# Patient Record
Sex: Male | Born: 2005 | Race: Black or African American | Hispanic: No | Marital: Single | Smoking: Former smoker
Health system: Southern US, Community
[De-identification: ages and names within clinical notes are randomized; demographics above are authoritative.]

## PROBLEM LIST (undated history)

## (undated) DIAGNOSIS — R7303 Prediabetes: Secondary | ICD-10-CM

## (undated) HISTORY — PX: CIRCUMCISION REVISION: SHX1347

## (undated) HISTORY — DX: Prediabetes: R73.03

## (undated) HISTORY — PX: CIRCUMCISION: SUR203

---
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
## 2023-03-05 ENCOUNTER — Ambulatory Visit (HOSPITAL_COMMUNITY)
Admission: RE | Admit: 2023-03-05 | Discharge: 2023-03-05 | Disposition: A | Discharge status: Home / Self Care | Payer: Medicaid Other | Source: Ambulatory Visit | Attending: Internal Medicine | Admitting: Internal Medicine

## 2023-03-05 ENCOUNTER — Encounter (HOSPITAL_COMMUNITY): Payer: Self-pay

## 2023-03-05 VITALS — BP 116/74 | HR 77 | Temp 98.3°F | Resp 18

## 2023-03-05 DIAGNOSIS — J0120 Acute ethmoidal sinusitis, unspecified: Secondary | ICD-10-CM | POA: Diagnosis not present

## 2023-03-05 MED ORDER — FLUTICASONE PROPIONATE 50 MCG/ACT NA SUSP: 2.0000 | Freq: Every day | NASAL | 0 refills | Status: DC

## 2023-03-05 MED ORDER — AMOXICILLIN-POT CLAVULANATE 875-125 MG PO TABS: 1.0000 | ORAL_TABLET | Freq: Two times a day (BID) | ORAL | 0 refills | Status: DC

## 2023-03-05 NOTE — ED Triage Notes (Signed)
 Patient presents with c/o nasal congestion, sinus pressure, headache and dizziness for one week. Patient states he has not taken anything for the symptoms.

## 2023-03-05 NOTE — ED Provider Notes (Signed)
 [facility]    CSN: [id number] Arrival date & time: 03/05/23  1325      History   Chief Complaint Chief Complaint  Patient presents with   Dizziness    Not feeling well at all, maybe sinusitis or the flu - Entered by patient   Facial Pain   Nasal Congestion    HPI Jorge Cochran is a 18 y.o. male who presents with nose congestion, sinus pressure, HA x 1 week. Has  ago. 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 Has not been taking any medications. The nose mucous has been yellow with some tinges of blood at time. Has missed school all week. He has also had R upper molar pain, but does not have any dental problems.     Past Medical History:  Diagnosis Date   Prediabetes     Patient Active Problem List   Diagnosis Date Noted   Other hyperlipidemia 10/01/2021   Prediabetes 08/17/2021   Tobacco use 08/17/2021   Mixed hyperlipidemia 07/13/2020   Class 3 severe obesity with serious comorbidity and body mass index (BMI) of 40.0 to 44.9 in adult (HCC) 07/13/2020   Insulin resistance 10/12/2019   Vitamin D deficiency 06/23/2019    Past Surgical History:  Procedure Laterality Date   CIRCUMCISION     CIRCUMCISION REVISION         Home Medications    Prior to Admission medications   Medication Sig Start Date End Date Taking? Authorizing Provider  amoxicillin-clavulanate (AUGMENTIN) 875-125 MG tablet Take 1 tablet by mouth every 12 (twelve) hours. 03/05/23  Yes Rodriguez-Southworth, Nettie Elm, PA-C  fluticasone (FLONASE) 50 MCG/ACT nasal spray Place 2 sprays into both nostrils daily. 03/05/23  Yes Rodriguez-Southworth, Nettie Elm, PA-C    Family History Family History  Problem Relation Age of Onset   Diabetes Mother    Diabetes Other    Diabetes Maternal Grandfather    Hypertension Maternal Grandfather     Social History Social History   Tobacco Use   Smoking status:  Former    Current packs/day: 0.25    Average packs/day: 0.3 packs/day for 3.1 years (0.8 ttl pk-yrs)    Types: Cigarettes    Start date: 01/15/2020   Smokeless tobacco: Never  Vaping Use   Vaping status: Every Day  Substance Use Topics   Alcohol use: Never   Drug use: Never     Allergies   Patient has no known allergies.   Review of Systems Review of Systems As noted inHPI  Physical Exam Triage Vital Signs ED Triage Vitals  Encounter Vitals Group     BP 03/05/23 1401 116/74     Systolic BP Percentile --      Diastolic BP Percentile --      Pulse Rate 03/05/23 1401 77     Resp 03/05/23 1401 18     Temp 03/05/23 1401 98.3 F (36.8 C)     Temp Source 03/05/23 1401 Oral     SpO2 03/05/23 1401 96 %     Weight --      Height --      Head Circumference --      Peak Flow --      Pain Score 03/05/23 1359 7     Pain Loc --      Pain Education --      Exclude from Growth Chart --  Orthostatic VS for the past 24 hrs:  BP- Lying Pulse- Lying BP- Sitting Pulse- Sitting BP- Standing at 0 minutes Pulse- Standing at 0 minutes  03/05/23 1436 117/71 86 110/77 74 119/80 87    Updated Vital Signs BP 116/74 (BP Location: Left Arm)   Pulse 77   Temp 98.3 F (36.8 C) (Oral)   Resp 18   SpO2 96%   Visual Acuity Right Eye Distance:   Left Eye Distance:   Bilateral Distance:    Right Eye Near:   Left Eye Near:    Bilateral Near:     Physical Exam Vitals and nursing note reviewed.  Constitutional:      General: He is not in acute distress.    Appearance: He is normal weight. He is not toxic-appearing.  HENT:     Head: Normocephalic.     Right Ear: Tympanic membrane, ear canal and external ear normal.     Left Ear: Tympanic membrane and external ear normal.     Nose: Congestion present.     Comments: Has tenderness on R ethmoid and frontal sinuses     Mouth/Throat:     Mouth: Mucous membranes are moist.     Pharynx: Oropharynx is clear.  Eyes:     General: Lids are  normal. No scleral icterus.       Right eye: No discharge or hordeolum.        Left eye: No discharge or hordeolum.     Extraocular Movements:     Right eye: Normal extraocular motion.     Left eye: Normal extraocular motion.     Conjunctiva/sclera: Conjunctivae normal.     Right eye: Right conjunctiva is not injected. No hemorrhage.    Left eye: Left conjunctiva is not injected. No hemorrhage. Cardiovascular:     Rate and Rhythm: Normal rate and regular rhythm.     Heart sounds: No murmur heard. Pulmonary:     Effort: Pulmonary effort is normal.     Breath sounds: Normal breath sounds.  Musculoskeletal:        General: Normal range of motion.     Cervical back: Neck supple.  Lymphadenopathy:     Cervical: No cervical adenopathy.  Skin:    General: Skin is warm and dry.     Findings: No rash.  Neurological:     Mental Status: He is alert and oriented to person, place, and time.     Gait: Gait normal.  Psychiatric:        Mood and Affect: Mood normal.        Behavior: Behavior normal.        Thought Content: Thought content normal.        Judgment: Judgment normal.      UC Treatments / Results  Labs (all labs ordered are listed, but only abnormal results are displayed) Labs Reviewed - No data to display  EKG   Radiology No results found.  Procedures Procedures (including critical care time)  Medications Ordered in UC Medications - No data to display  Initial Impression / Assessment and Plan / UC Course  I have reviewed the triage vital signs and the nursing notes.  R ethmoid and frontal sinus headaches and possible infections. I believe the cold he had with trying to fly has provoked worsening of sinus inflammations.   I placed him on Flonase and Augmentin as noted.   Final Clinical Impressions(s) / UC Diagnoses   Final diagnoses:  Acute ethmoidal sinusitis, recurrence  not specified   Discharge Instructions   None    ED Prescriptions      Medication Sig Dispense Auth. Provider   fluticasone (FLONASE) 50 MCG/ACT nasal spray Place 2 sprays into both nostrils daily. 16 g Rodriguez-Southworth, Aarion Metzgar, PA-C   amoxicillin-clavulanate (AUGMENTIN) 875-125 MG tablet Take 1 tablet by mouth every 12 (twelve) hours. 14 tablet Rodriguez-Southworth, Nettie Elm, PA-C      PDMP not reviewed this encounter.   Garey Ham, [place] 03/05/23 1514

## 2023-05-12 ENCOUNTER — Ambulatory Visit (HOSPITAL_COMMUNITY)
Admission: EM | Admit: 2023-05-12 | Discharge: 2023-05-12 | Disposition: A | Discharge status: Home / Self Care | Attending: Family Medicine | Admitting: Family Medicine

## 2023-05-12 ENCOUNTER — Other Ambulatory Visit: Payer: Self-pay

## 2023-05-12 DIAGNOSIS — L509 Urticaria, unspecified: Secondary | ICD-10-CM

## 2023-05-12 MED ORDER — METHYLPREDNISOLONE SODIUM SUCC 125 MG IJ SOLR
INTRAMUSCULAR | Status: AC
  Filled 2023-05-12: qty 2

## 2023-05-12 MED ORDER — METHYLPREDNISOLONE 4 MG PO TBPK: ORAL_TABLET | ORAL | 0 refills | Status: DC

## 2023-05-12 MED ORDER — METHYLPREDNISOLONE SODIUM SUCC 125 MG IJ SOLR
80.0000 mg | Freq: Once | INTRAMUSCULAR | Status: AC
  Administered 2023-05-12: 80 mg via INTRAMUSCULAR

## 2023-05-12 NOTE — ED Provider Notes (Signed)
 [facility]    CSN: [id number] Arrival date & time: 05/12/23  0957      History   Chief Complaint Chief Complaint  Patient presents with   Urticaria    HPI Jorge Cochran is a 18 y.o. male.    Urticaria  Patient is here for breaking out into hives randomly since yesterday.  Comes and goes.  Right now the rash is located on his legs and arms.  Right now his left foot is also numb, the right hand was a bit numb.  No otc medications were taken.  No new foods, medications or topicals.  No difficulty breathing or sob.  He is in school, it is finals.        Past Medical History:  Diagnosis Date   Prediabetes     Patient Active Problem List   Diagnosis Date Noted   Other hyperlipidemia 10/01/2021   Prediabetes 08/17/2021   Tobacco use 08/17/2021   Mixed hyperlipidemia 07/13/2020   Class 3 severe obesity with serious comorbidity and body mass index (BMI) of 40.0 to 44.9 in adult [facility]) 07/13/2020   Insulin  resistance 10/12/2019   Vitamin D  deficiency 06/23/2019    Past Surgical History:  Procedure Laterality Date   CIRCUMCISION     CIRCUMCISION REVISION         Home Medications    Prior to Admission medications   Medication Sig Start Date End Date Taking? Authorizing Provider  amoxicillin -clavulanate (AUGMENTIN ) 875-125 MG tablet Take 1 tablet by mouth every 12 (twelve) hours. 03/05/23   Rodriguez-Southworth, Sylvia, PA-C  fluticasone  (FLONASE ) 50 MCG/ACT nasal spray Place 2 sprays into both nostrils daily. 03/05/23   Rodriguez-Southworth, Lamond Pilot, PA-C    Family History Family History  Problem Relation Age of Onset   Diabetes Mother    Diabetes Other    Diabetes Maternal Grandfather    Hypertension Maternal Grandfather     Social History Social History   Tobacco Use   Smoking status: Former    Current packs/day: 0.25    Average packs/day: 0.3 packs/day for 3.3 years (0.8 ttl pk-yrs)    Types: Cigarettes    Start date: 01/15/2020    Smokeless tobacco: Never  Vaping Use   Vaping status: Every Day  Substance Use Topics   Alcohol use: Never   Drug use: Never     Allergies   Patient has no known allergies.   Review of Systems Review of Systems  Constitutional: Negative.   HENT: Negative.    Respiratory: Negative.    Cardiovascular: Negative.   Gastrointestinal: Negative.   Skin:  Positive for rash.  Neurological:  Positive for numbness.  Psychiatric/Behavioral: Negative.       Physical Exam Triage Vital Signs ED Triage Vitals  Encounter Vitals Group     BP 05/12/23 1038 119/77     Systolic BP Percentile --      Diastolic BP Percentile --      Pulse Rate 05/12/23 1038 79     Resp 05/12/23 1038 20     Temp 05/12/23 1038 98.3 F (36.8 C)     Temp src --      SpO2 05/12/23 1038 96 %     Weight --      Height --      Head Circumference --      Peak Flow --      Pain Score 05/12/23 1036 0     Pain Loc --      Pain Education --  Exclude from Growth Chart --    No data found.  Updated Vital Signs BP 119/77   Pulse 79   Temp 98.3 F (36.8 C)   Resp 20   SpO2 96%   Visual Acuity Right Eye Distance:   Left Eye Distance:   Bilateral Distance:    Right Eye Near:   Left Eye Near:    Bilateral Near:     Physical Exam Constitutional:      General: He is not in acute distress.    Appearance: Normal appearance. He is normal weight. He is not ill-appearing or toxic-appearing.  HENT:     Nose: Nose normal.  Cardiovascular:     Rate and Rhythm: Normal rate and regular rhythm.  Pulmonary:     Effort: Pulmonary effort is normal.     Breath sounds: Normal breath sounds. No wheezing or rhonchi.  Musculoskeletal:     Cervical back: Normal range of motion and neck supple.  Skin:    Comments: Raised erythematous papules to the antecubital fossa bilaterally;  the back of the left hand;  The thighs, scattered down the legs;   Neurological:     General: No focal deficit present.     Mental  Status: He is alert and oriented to person, place, and time.     Sensory: No sensory deficit.     Motor: No weakness.  Psychiatric:        Mood and Affect: Mood normal.      UC Treatments / Results  Labs (all labs ordered are listed, but only abnormal results are displayed) Labs Reviewed - No data to display  EKG   Radiology No results found.  Procedures Procedures (including critical care time)  Medications Ordered in UC Medications  methylPREDNISolone sodium succinate (SOLU-MEDROL) 125 mg/2 mL injection 80 mg (has no administration in time range)    Initial Impression / Assessment and Plan / UC Course  I have reviewed the triage vital signs and the nursing notes.  Pertinent labs & imaging results that were available during my care of the patient were reviewed by me and considered in my medical decision making (see chart for details).    Final Clinical Impressions(s) / UC Diagnoses   Final diagnoses:  Hives     Discharge Instructions      You were seen today for hives.  I have given you a shot of a steroid today.  I have sent out oral prednisone to start tomorrow.  You may use zyrtec/clartin, as well as benadryl over the counter for itching.  Return if not improving.      ED Prescriptions     Medication Sig Dispense Auth. Provider   methylPREDNISolone (MEDROL DOSEPAK) 4 MG TBPK tablet Take as directed 1 each Lesle Ras, MD      PDMP not reviewed this encounter.   Lesle Ras, MD 05/12/23 1054

## 2023-05-12 NOTE — ED Triage Notes (Addendum)
 PT reports he breaks out in hives randomly all over his body starting yesterday. PT has not tried any OTC. Pt also reports random numbness in Lt foot and RT hand.

## 2023-05-12 NOTE — Discharge Instructions (Signed)
 You were seen today for hives.  I have given you a shot of a steroid today.  I have sent out oral prednisone to start tomorrow.  You may use zyrtec/clartin, as well as benadryl over the counter for itching.  Return if not improving.

## 2023-05-12 NOTE — ED Triage Notes (Signed)
 PT DOB and full name confirmed .

## 2023-05-22 ENCOUNTER — Ambulatory Visit (INDEPENDENT_AMBULATORY_CARE_PROVIDER_SITE_OTHER)

## 2023-05-22 ENCOUNTER — Ambulatory Visit (HOSPITAL_COMMUNITY)
Admission: RE | Admit: 2023-05-22 | Discharge: 2023-05-22 | Disposition: A | Discharge status: Home / Self Care | Payer: Self-pay | Source: Ambulatory Visit | Attending: Family Medicine | Admitting: Family Medicine

## 2023-05-22 ENCOUNTER — Encounter (HOSPITAL_COMMUNITY): Payer: Self-pay

## 2023-05-22 VITALS — BP 120/79 | HR 88 | Temp 98.5°F | Resp 18

## 2023-05-22 DIAGNOSIS — M533 Sacrococcygeal disorders, not elsewhere classified: Secondary | ICD-10-CM | POA: Diagnosis not present

## 2023-05-22 NOTE — ED Provider Notes (Signed)
 [facility]    CSN: [id number] Arrival date & time: 05/22/23  1004      History   Chief Complaint Chief Complaint  Patient presents with   Fall   Tailbone Pain   Loss of Consciousness    HPI Jorge Cochran is a 18 y.o. male.    Fall  Loss of Consciousness  Patient is here for tailbone pain after a fall.  Earlier in the week he "passed out" and fell and hit his head.  He is not sure why this happened.  He is feeling well otherwise, expect for continued low back/tailbone pain.  No numbness/tingling into the legs.  No bowel/bladder issues.  He otherwise feels well.     Past Medical History:  Diagnosis Date   Prediabetes     Patient Active Problem List   Diagnosis Date Noted   Other hyperlipidemia 10/01/2021   Prediabetes 08/17/2021   Tobacco use 08/17/2021   Mixed hyperlipidemia 07/13/2020   Class 3 severe obesity with serious comorbidity and body mass index (BMI) of 40.0 to 44.9 in adult 07/13/2020   Insulin  resistance 10/12/2019   Vitamin D  deficiency 06/23/2019    Past Surgical History:  Procedure Laterality Date   CIRCUMCISION     CIRCUMCISION REVISION         Home Medications    Prior to Admission medications   Medication Sig Start Date End Date Taking? Authorizing Provider  methylPREDNISolone  (MEDROL  DOSEPAK) 4 MG TBPK tablet Take as directed 05/12/23   Lesle Ras, MD    Family History Family History  Problem Relation Age of Onset   Diabetes Mother    Diabetes Other    Diabetes Maternal Grandfather    Hypertension Maternal Grandfather     Social History Social History   Tobacco Use   Smoking status: Former    Current packs/day: 0.25    Average packs/day: 0.3 packs/day for 3.3 years (0.8 ttl pk-yrs)    Types: Cigarettes    Start date: 01/15/2020   Smokeless tobacco: Never  Vaping Use   Vaping status: Every Day  Substance Use Topics   Alcohol use: Never   Drug use: Never     Allergies   Patient has no known  allergies.   Review of Systems Review of Systems  Constitutional: Negative.   HENT: Negative.    Respiratory: Negative.    Cardiovascular:  Positive for syncope.  Gastrointestinal: Negative.   Musculoskeletal:  Positive for back pain.     Physical Exam Triage Vital Signs ED Triage Vitals  Encounter Vitals Group     BP 05/22/23 1115 120/79     Systolic BP Percentile --      Diastolic BP Percentile --      Pulse Rate 05/22/23 1115 88     Resp 05/22/23 1115 18     Temp 05/22/23 1115 98.5 F (36.9 C)     Temp Source 05/22/23 1115 Oral     SpO2 05/22/23 1115 97 %     Weight --      Height --      Head Circumference --      Peak Flow --      Pain Score 05/22/23 1114 10     Pain Loc --      Pain Education --      Exclude from Growth Chart --    No data found.  Updated Vital Signs BP 120/79 (BP Location: Left Arm)   Pulse 88   Temp 98.5 F (  36.9 C) (Oral)   Resp 18   SpO2 97%   Visual Acuity Right Eye Distance:   Left Eye Distance:   Bilateral Distance:    Right Eye Near:   Left Eye Near:    Bilateral Near:     Physical Exam Constitutional:      Appearance: Normal appearance. He is normal weight.  Musculoskeletal:     Comments: TTP to the sacrum and coccyx;  able to sit on the table with minimal pain   Neurological:     General: No focal deficit present.     Mental Status: He is alert.  Psychiatric:        Mood and Affect: Mood normal.      UC Treatments / Results  Labs (all labs ordered are listed, but only abnormal results are displayed) Labs Reviewed - No data to display  EKG   Radiology No results found.  Procedures Procedures (including critical care time)  Medications Ordered in UC Medications - No data to display  Initial Impression / Assessment and Plan / UC Course  I have reviewed the triage vital signs and the nursing notes.  Pertinent labs & imaging results that were available during my care of the patient were reviewed by  me and considered in my medical decision making (see chart for details).   Final Clinical Impressions(s) / UC Diagnoses   Final diagnoses:  Coccyx pain     Discharge Instructions      You were seen today for tailbone pain after a fall.  Your xray appears normal, but I will call you if the radiologist reads this differently.  Either way, we recommend motrin/advil for any pain, and given this time.  You may get a donut pillow to help alleviate pressure.  Please return if you have worsening symptoms over time.   ED Prescriptions   None    PDMP not reviewed this encounter.   Lesle Ras, MD 05/22/23 1201

## 2023-05-22 NOTE — ED Triage Notes (Signed)
 Pt states that he passed out last Tuesday twice and hit his head and tailbone. He now complains of tailbone pain which hasn't stopped since passing out. He has been icing, IBU and stretching but no relief.

## 2023-05-22 NOTE — Discharge Instructions (Addendum)
 You were seen today for tailbone pain after a fall.  Your xray appears normal, but I will call you if the radiologist reads this differently.  Either way, we recommend motrin/advil for any pain, and given this time.  You may get a donut pillow to help alleviate pressure.  Please return if you have worsening symptoms over time.

## 2023-05-25 ENCOUNTER — Ambulatory Visit (HOSPITAL_COMMUNITY): Admission: EM | Admit: 2023-05-25 | Discharge: 2023-05-25 | Disposition: A | Discharge status: Home / Self Care

## 2023-05-25 ENCOUNTER — Encounter (HOSPITAL_COMMUNITY): Payer: Self-pay

## 2023-05-25 DIAGNOSIS — S300XXA Contusion of lower back and pelvis, initial encounter: Secondary | ICD-10-CM

## 2023-05-25 MED ORDER — TRAMADOL HCL 50 MG PO TABS: 50.0000 mg | ORAL_TABLET | Freq: Four times a day (QID) | ORAL | 0 refills | Status: DC | PRN

## 2023-05-25 MED ORDER — CYCLOBENZAPRINE HCL 10 MG PO TABS: 10.0000 mg | ORAL_TABLET | Freq: Three times a day (TID) | ORAL | 0 refills | Status: DC | PRN

## 2023-05-25 NOTE — ED Triage Notes (Signed)
 Patient was seen 05/22/23 for the same tailbone pain. States it is worse, unable to sleep, trouble moving. Denies any further injury or falls. Unable to lay flat on the side, back, or stomach. Having a hard time standing.   Patient taking ibuprofen, Tylenol, and Advil with no relief. Tried Icy Hot topical and ice packs with no relief.

## 2023-05-25 NOTE — ED Provider Notes (Signed)
 [facility]  Note:  This document was prepared using Dragon voice recognition software and may include unintentional dictation errors.  MRN: [id number] DOB: [birth year]  Subjective:   Jorge Cochran is a 18 y.o. male presenting for continued pain to his tailbone after a fall that occurred approximately 2 weeks ago.  Patient was seen here in urgent care x-ray performed which was negative for fracture to the tailbone.  Patient states that he has not slept in 3 days because he cannot get comfortable laying on his side back or abdomen.  Patient has continued pain and has taken ibuprofen, Tylenol, IcyHot, ice with no improvement.  Patient denies any other trauma or injury to the tailbone since previous.  No current facility-administered medications for this encounter.  Current Outpatient Medications:    cyclobenzaprine (FLEXERIL) 10 MG tablet, Take 1 tablet (10 mg total) by mouth 3 (three) times daily as needed for muscle spasms., Disp: 30 tablet, Rfl: 0   methylPREDNISolone  (MEDROL  DOSEPAK) 4 MG TBPK tablet, Take as directed, Disp: 1 each, Rfl: 0   traMADol (ULTRAM) 50 MG tablet, Take 1 tablet (50 mg total) by mouth every 6 (six) hours as needed., Disp: 15 tablet, Rfl: 0   No Known Allergies  Past Medical History:  Diagnosis Date   Prediabetes      Past Surgical History:  Procedure Laterality Date   CIRCUMCISION     CIRCUMCISION REVISION      Family History  Problem Relation Age of Onset   Diabetes Mother    Diabetes Other    Diabetes Maternal Grandfather    Hypertension Maternal Grandfather     Social History   Tobacco Use   Smoking status: Former    Current packs/day: 0.25    Average packs/day: 0.3 packs/day for 3.4 years (0.8 ttl pk-yrs)    Types: Cigarettes    Start date: 01/15/2020   Smokeless tobacco: Never  Vaping Use   Vaping status: Every Day  Substance Use Topics   Alcohol use: Never   Drug use: Never    ROS Refer to HPI for ROS  details.  Objective:   Vitals: BP 117/63 (BP Location: Left Arm)   Pulse 95   Temp 98 F (36.7 C) (Oral)   Resp 18   SpO2 97%   Physical Exam Vitals and nursing note reviewed.  Constitutional:      General: He is not in acute distress.    Appearance: He is well-developed. He is not ill-appearing or toxic-appearing.  HENT:     Head: Normocephalic.  Cardiovascular:     Rate and Rhythm: Normal rate.  Pulmonary:     Effort: Pulmonary effort is normal. No respiratory distress.  Musculoskeletal:     Lumbar back: Tenderness and bony tenderness present. No swelling, signs of trauma or spasms. Normal range of motion.  Skin:    General: Skin is warm and dry.  Neurological:     General: No focal deficit present.     Mental Status: He is alert and oriented to person, place, and time.  Psychiatric:        Mood and Affect: Mood normal.        Behavior: Behavior normal.     Procedures  No results found for this or any previous visit (from the past 24 hours).  No results found.   Assessment and Plan :     Discharge Instructions       1. Coccyx contusion, initial encounter (Primary) - cyclobenzaprine (FLEXERIL) 10 MG  tablet; Take 1 tablet (10 mg total) by mouth 3 (three) times daily as needed for muscle spasms.  Dispense: 30 tablet; Refill: 0 - traMADol (ULTRAM) 50 MG tablet; Take 1 tablet (50 mg total) by mouth every 6 (six) hours as needed.  Dispense: 15 tablet; Refill: 0 - Continue to rest the area, apply ice 2-3 times a day for 10 to 15 minutes at a time. -Unfortunately there is no significant therapy that will improve pain and inflammation to the tailbone. -Continue to monitor symptoms for any change in severity if there is any escalation of current symptoms or development of new symptoms follow-up in ER for further evaluation and management.    Jesselyn Rask B Jonnae Fonseca   Henrik Orihuela, [place], [place] 05/25/23 1356

## 2023-05-25 NOTE — Discharge Instructions (Addendum)
  1. Coccyx contusion, initial encounter (Primary) - cyclobenzaprine (FLEXERIL) 10 MG tablet; Take 1 tablet (10 mg total) by mouth 3 (three) times daily as needed for muscle spasms.  Dispense: 30 tablet; Refill: 0 - traMADol (ULTRAM) 50 MG tablet; Take 1 tablet (50 mg total) by mouth every 6 (six) hours as needed.  Dispense: 15 tablet; Refill: 0 - Continue to rest the area, apply ice 2-3 times a day for 10 to 15 minutes at a time. -Unfortunately there is no significant therapy that will improve pain and inflammation to the tailbone. -Continue to monitor symptoms for any change in severity if there is any escalation of current symptoms or development of new symptoms follow-up in ER for further evaluation and management.

## 2023-05-29 ENCOUNTER — Emergency Department (HOSPITAL_BASED_OUTPATIENT_CLINIC_OR_DEPARTMENT_OTHER)
Admission: EM | Admit: 2023-05-29 | Discharge: 2023-05-29 | Disposition: A | Discharge status: Home / Self Care | Attending: Emergency Medicine | Admitting: Emergency Medicine

## 2023-05-29 ENCOUNTER — Emergency Department (HOSPITAL_BASED_OUTPATIENT_CLINIC_OR_DEPARTMENT_OTHER): Admitting: Radiology

## 2023-05-29 ENCOUNTER — Encounter (HOSPITAL_BASED_OUTPATIENT_CLINIC_OR_DEPARTMENT_OTHER): Payer: Self-pay | Admitting: Emergency Medicine

## 2023-05-29 ENCOUNTER — Other Ambulatory Visit (HOSPITAL_BASED_OUTPATIENT_CLINIC_OR_DEPARTMENT_OTHER): Payer: Self-pay

## 2023-05-29 ENCOUNTER — Other Ambulatory Visit: Payer: Self-pay

## 2023-05-29 DIAGNOSIS — M533 Sacrococcygeal disorders, not elsewhere classified: Secondary | ICD-10-CM | POA: Diagnosis present

## 2023-05-29 DIAGNOSIS — L0591 Pilonidal cyst without abscess: Secondary | ICD-10-CM | POA: Diagnosis not present

## 2023-05-29 MED ORDER — SULFAMETHOXAZOLE-TRIMETHOPRIM 800-160 MG PO TABS
1.0000 | ORAL_TABLET | Freq: Two times a day (BID) | ORAL | 0 refills | Status: AC
  Filled 2023-05-29: qty 14, 7d supply, fill #0

## 2023-05-29 MED ORDER — SULFAMETHOXAZOLE-TRIMETHOPRIM 800-160 MG PO TABS
1.0000 | ORAL_TABLET | Freq: Once | ORAL | Status: AC
  Administered 2023-05-29: 1 via ORAL
  Filled 2023-05-29: qty 1

## 2023-05-29 MED ORDER — OXYCODONE-ACETAMINOPHEN 5-325 MG PO TABS
1.0000 | ORAL_TABLET | Freq: Once | ORAL | Status: AC
  Administered 2023-05-29: 1 via ORAL
  Filled 2023-05-29: qty 1

## 2023-05-29 MED ORDER — OXYCODONE-ACETAMINOPHEN 5-325 MG PO TABS
1.0000 | ORAL_TABLET | Freq: Four times a day (QID) | ORAL | 0 refills | Status: DC | PRN
  Filled 2023-05-29: qty 5, 2d supply, fill #0

## 2023-06-03 ENCOUNTER — Encounter (HOSPITAL_COMMUNITY): Payer: Self-pay

## 2023-06-03 NOTE — Progress Notes (Signed)
 REFERRING PHYSICIAN:  Mannie Fairy Shlomo DEWAINE PROVIDER:  RICHERD SILVERSMITH, MD MRN: [id number] DOB: 09-25-2005 DATE OF ENCOUNTER: 06/03/2023 Subjective     Reason for Referral: Pilonidal cyst History of Present Illness: Jorge Cochran is a 18 y.o. male who is seen today as an office consultation for evaluation of pilonidal cyst.  Patient had a fall earlier this month associated with syncope and had persistent tailbone pain for which he presented to UC on 5/8. XR obtained showed no fracture of coccyx. Urgent care provider noted tenderness to palpation of coccyx and sacrum. He was discharged home with recommendations to take tylenol  and motrin for pain. He represented on 5/11 for persistent pain. Per review of UC provider notes, tenderness to palpation was noted to lumbar spine but does not mention coccyx or sacrum or whether these were examined. He was discharged home with flexeril , and tramadol .  Patient then presented to ED at [facility] on 5/15 for non resolving pain. Per review of EDP notes, patient reported bleeding and foul smelling drainage from tailbone for several days. XR ordered which was unremarkable. POCUS by EDP did not reveal any drainable collection. Did report erythema to area of concern. Patient was discharged on Bactrim  and percocet and referral sent to general surgery.  Patient states he has never had pain like this before, never noted a cyst or drainage previously to this episode.  Discussed his syncope noted in EDP notes. States he had been under stress, recently had stress related hives, presumed vasovagal event.    Review of Systems: A complete review of systems was obtained from the patient.  I have reviewed this information and discussed as appropriate with the patient.  ROS otherwise negative except as noted in HPI.   Medical History: Past Medical History:  Diagnosis Date  . Hyperlipidemia     There is no problem list on file for this patient.   Past  Surgical History:  Procedure Laterality Date  . CIRCUMCISION    . circumcision revision       No Known Allergies  Current Outpatient Medications on File Prior to Visit  Medication Sig Dispense Refill  . oxyCODONE -acetaminophen  (PERCOCET) 5-325 mg tablet Take 1 tablet by mouth    . sulfamethoxazole -trimethoprim  (BACTRIM  DS) 800-160 mg tablet Take 1 tablet by mouth 2 (two) times daily     No current facility-administered medications on file prior to visit.    Family History  Problem Relation Age of Onset  . Diabetes Mother      Social History   Tobacco Use  Smoking Status Never  Smokeless Tobacco Never     Social History   Socioeconomic History  . Marital status: Single  Tobacco Use  . Smoking status: Never  . Smokeless tobacco: Never  Vaping Use  . Vaping status: Every Day  Substance and Sexual Activity  . Alcohol use: Yes  . Drug use: Yes    Objective:   Vitals:   06/03/23 1050  BP: 110/72  Pulse: 100  Temp: 36.1 C (97 F)  SpO2: 99%  Weight: (!) 112.5 kg (248 lb)  Height: 175.3 cm (5' 9)  PainSc:   4  PainLoc: Buttocks    Body mass index is 36.62 kg/m.  Physical Exam: General: No acute distress, well appearing HEENT: PERRL, hearing grossly normal, mucous membranes moist CV: Regular rate and rhythm Pulm: Normal work of breathing on room air Abd: Soft, nontender, nondistended GU: Small 2mm opening at tip of coccyx with SS drainage, no erythema,  no purulence, no tenderness. No notable pits. Extremities: Warm and well perfused Neuro: A&O x4, no focal neurologic deficits Psych: Appropriate mood and effect  Labs, Imaging and Diagnostic Testing: I have personally reviewed all imaging and agree with radiologist's interpretation. Sacrum/Coccyx XR 05/29/23: No evidence of fracture No recent labs. I personally reviewed recent ED provider notes.  Assessment and Plan:     Diagnoses and all orders for this visit:  Pilonidal cyst    Given infection  is resolved, reasonable to take wait and watch approach versus excision of cyst. Patient would prefer to observe for now. He will complete his antibiotics which should finish today.  He knows if any symptoms recur he should call our clinic and I am happy to see him back to discuss excision.  Discussed with patient given his syncope, even though likely vasovagal from stress, he should establish care with a PCP for routine check ups and physical exams, labs, etc.      I spent a total of 35 minutes in both face-to-face and non-face-to-face activities, excluding procedures performed, for this visit on the date of this encounter.  Orie Silversmith, MD [facility]

## 2023-06-25 NOTE — Progress Notes (Signed)
 Forms completed and faxed to [employer]

## 2023-09-22 ENCOUNTER — Ambulatory Visit (INDEPENDENT_AMBULATORY_CARE_PROVIDER_SITE_OTHER)

## 2023-09-22 VITALS — BP 126/70 | HR 59 | Ht 69.0 in | Wt 253.0 lb

## 2023-09-22 DIAGNOSIS — R7303 Prediabetes: Secondary | ICD-10-CM | POA: Diagnosis present

## 2023-09-22 DIAGNOSIS — E782 Mixed hyperlipidemia: Secondary | ICD-10-CM

## 2023-09-22 DIAGNOSIS — Z6841 Body Mass Index (BMI) 40.0 and over, adult: Secondary | ICD-10-CM | POA: Diagnosis not present

## 2023-09-22 DIAGNOSIS — M940 Chondrocostal junction syndrome [Tietze]: Secondary | ICD-10-CM | POA: Diagnosis not present

## 2023-09-22 DIAGNOSIS — E66813 Obesity, class 3: Secondary | ICD-10-CM

## 2023-09-22 LAB — POCT GLYCOSYLATED HEMOGLOBIN (HGB A1C): Hemoglobin A1C: 5 % (ref 4.0–5.6)

## 2023-09-22 MED ORDER — DICLOFENAC SODIUM 1 % EX GEL: 2.0000 g | Freq: Four times a day (QID) | CUTANEOUS | 1 refills | Status: DC

## 2023-09-22 NOTE — Patient Instructions (Signed)
 It was great to meet you today!  Today we talked about your weight loss goals, your diet and exercise plan, your sleep schedule and your rib pain.   I think you're doing a lot of really good things with your diet and exercise habits. Keep up the good work. We're going to do blood tests today to get a new baseline for you moving forward. These will check your blood sugar, cholesterol, kidney function and liver health.  For your sleep, I want you to aim for no fewer than 6 hours of sleep, ideally 7 or more. Limit your screens for an hour before you want to sleep to allow your brain to wind down. Keep your room cool and dark.   For your rib pain you likely have inflammation of the junction of your rib and sternum, called costochondritis. I'll prescribe you a topical anti-inflammatory called voltaren  gel that you can apply 2-4 times per day which should help with the discomfort.   Please follow up in 3 months.   Thank you for choosing [facility].   Please call [phone] with any questions about today's appointment.  Leafy Scriver, DO Family Medicine

## 2023-09-22 NOTE — Assessment & Plan Note (Signed)
 Prior A1c borderline at 5.5 in 2023. Denies polyuria or polydipsia, largely asymptomatic hx at this time.  -A1c, CMP today.  Diet and exercise interventions on board appropriately

## 2023-09-22 NOTE — Assessment & Plan Note (Signed)
 Prior LDL 114 in 2023.  -Lipid panel ordered for today. Diet and exercise interventions on board appropriately for now

## 2023-09-22 NOTE — Assessment & Plan Note (Signed)
 Patient's BMI 37 today, 253 lb up from patient reported 220 in the last year.  -CMP given prior hx of elevated LFTs Diet and exercise interventions on board appropriately at this time

## 2023-09-22 NOTE — Progress Notes (Cosign Needed Addendum)
 SUBJECTIVE:   Chief compliant/HPI: annual examination  Jorge Cochran is a 18 y.o. who presents today for an annual exam.   Only acute concern today is R sided costochondral discomfort since 3 months ago. 3/7 days of the week. 7/10 at the worst, stretching hasn't really helped it. Lifting makes it worse, bending over, turning. Periods of transient discomfort, a couple hours at a time. 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   His diet and exercise routine involve waking up at 6, gym for an hour in the mornings. 3 eggs for breakfast, brings lunch or eats healthy options at work. Generally avoids sugary sweets, causes his acne to break out. He's emphasizing a high protein diet w/ veggies and fruits to try to lose weight given his hx of insulin  resistance and weight gain. At the gym he does weightlifting, treadmill and stair master for cardio. His goal weight is 212, he did have a breakup this year that led to him having a number of weeks where he stopped exercising, was eating lots of unhealthy food, but he feels like he's back on the wagon now and is motivated to reach his health goals.   He takes no medications every day except for OTC fish oil, Vit D.   OBJECTIVE:   BP 126/70   Pulse (!) 59   Ht 5' 9 (1.753 m)   Wt 253 lb (114.8 kg)   SpO2 97%   BMI 37.36 kg/m   General: Awake, alert, NAD. Communicates clearly. HEENT: NCAT, dentition intact. PERRLA EOMI. Anicteric sclera, conjunctiva clear.  MMM, clear OP w/ no exudates or erythema.  Cardio: RRR. Normal S1, S2. No murmur, rub, gallop. 2+ radial and dorsalis pedis pulses b/l w/ good capillary refill.  Resp: CTA bilaterally. No wheezes, rales, or rhonchi. Normal work of breathing on room air Abdomen: soft, non-tender, non-distended. Normoactive BS auscultated. No guarding, rigidity, or rebound.  MSK: Full ROM w/ no TTP or obvious deformity in b/l UE and LE. R sided chest wall TTP over medial rib  margin. Full shoulder ROM b/l with mild exacerbation of symptoms w/ extension + abduction + ext rotation of R shoulder.  Skin: good skin turgor, no rash or lesions appreciated. Mild hyperpigmentation around posterior neck and upper trap region, possible acanthosis given his hx. Neuro: AOx4. Bilateral UE and LE strength 5/5. Sensation to light touch intact and symmetrical bilaterally. Psych: Appropriate mood and affect. No SI/HI.   ASSESSMENT/PLAN:   Assessment & Plan Mixed hyperlipidemia Prior LDL 114 in 2023.  -Lipid panel ordered for today. Diet and exercise interventions on board appropriately for now Class 3 severe obesity with serious comorbidity and body mass index (BMI) of 40.0 to 44.9 in adult Patient's BMI 37 today, 253 lb up from patient reported 220 in the last year.  -CMP given prior hx of elevated LFTs Diet and exercise interventions on board appropriately at this time Prediabetes Prior A1c borderline at 5.5 in 2023. Denies polyuria or polydipsia, largely asymptomatic hx at this time.  -A1c, CMP today.  Diet and exercise interventions on board appropriately Costochondritis R sided medial rib pain, TTP, positional, intermittent symptoms w/o affecting his work duties. -Voltaren  4x daily   Annual Examination  See AVS for age appropriate recommendations  PHQ score 9, reviewed and discussed.  -Patient's mood questionnaire notable for little interest in doing things and feeling tired. Initial interventions targeted at increasing sleep and regulating his circadian rhythm, will see him in 3  months at which point it'll be appropriate to do further workup of underlying mood disorder.   Blood pressure reviewed and at goal.   Considered the following items based upon USPSTF recommendations: HIV testing: not indicated Hepatitis C: not indicated Hepatitis B: not indicated Syphilis if at high risk: {not indicated GC/CTnot indicated Lipid panel (nonfasting or fasting) discussed based  upon AHA recommendations and ordered.  Consider repeat every 4-6 years.  Reviewed risk factors for latent tuberculosis and not indicated Immunizations - declined   Follow up in 1 year or sooner if indicated.  MyChart Activation: Already signed up  Leafy Scriver, DO [facility]

## 2023-09-23 ENCOUNTER — Ambulatory Visit: Payer: Self-pay

## 2023-09-23 LAB — LIPID PANEL
Chol/HDL Ratio: 2.6 ratio (ref 0.0–5.0)
Cholesterol, Total: 161 mg/dL (ref 100–199)
HDL: 62 mg/dL (ref >39)
LDL Chol Calc (NIH): 88 mg/dL (ref 0–99)
Triglycerides: 54 mg/dL (ref 0–149)
VLDL Cholesterol Cal: 11 mg/dL (ref 5–40)

## 2023-09-23 LAB — COMPREHENSIVE METABOLIC PANEL WITH GFR
ALT: 44 IU/L (ref 0–44)
AST: 18 IU/L (ref 0–40)
Albumin: 4.4 g/dL (ref 4.3–5.2)
Alkaline Phosphatase: 49 IU/L — ABNORMAL LOW (ref 51–125)
BUN/Creatinine Ratio: 12 (ref 9–20)
BUN: 11 mg/dL (ref 6–20)
Bilirubin Total: 0.7 mg/dL (ref 0.0–1.2)
CO2: 23 mmol/L (ref 20–29)
Calcium: 9.6 mg/dL (ref 8.7–10.2)
Chloride: 105 mmol/L (ref 96–106)
Creatinine, Ser: 0.92 mg/dL (ref 0.76–1.27)
Globulin, Total: 2.5 g/dL (ref 1.5–4.5)
Glucose: 83 mg/dL (ref 70–99)
Potassium: 4.8 mmol/L (ref 3.5–5.2)
Sodium: 142 mmol/L (ref 134–144)
Total Protein: 6.9 g/dL (ref 6.0–8.5)
eGFR: 122 mL/min/1.73 (ref >59)

## 2024-01-24 ENCOUNTER — Ambulatory Visit (HOSPITAL_COMMUNITY): Admission: EM | Admit: 2024-01-24 | Discharge: 2024-01-24 | Disposition: A | Discharge status: Home / Self Care

## 2024-01-24 ENCOUNTER — Encounter (HOSPITAL_COMMUNITY): Payer: Self-pay

## 2024-01-24 DIAGNOSIS — J069 Acute upper respiratory infection, unspecified: Secondary | ICD-10-CM | POA: Insufficient documentation

## 2024-01-24 DIAGNOSIS — J029 Acute pharyngitis, unspecified: Secondary | ICD-10-CM | POA: Insufficient documentation

## 2024-01-24 LAB — POCT RAPID STREP A (OFFICE): Rapid Strep A Screen: NEGATIVE

## 2024-01-24 MED ORDER — PROMETHAZINE-DM 6.25-15 MG/5ML PO SYRP: 5.0000 mL | ORAL_SOLUTION | Freq: Every evening | ORAL | 0 refills | Status: AC | PRN

## 2024-01-24 NOTE — Discharge Instructions (Addendum)
 You have a viral illness which will improve on its own with rest, fluids, and medications to help with your symptoms.  Tylenol  and/or ibuprofen, guaifenesin (plain mucinex), and saline nasal sprays may help relieve symptoms.   Two teaspoons of honey in 1 cup of warm water every 4-6 hours may help with throat pains.  Salt water gargles, 1/2-1 teaspoon of salt dissolved in 1 cup of warm water, gargle and spit out every 4-6 hours for throat pains.  Humidifier in room at nighttime may help soothe cough (clean well daily).   Take Promethazine  DM cough medication to help with your cough at nighttime so that you are able to sleep. Do not drive, drink alcohol, or go to work while taking this medication since it can make you sleepy. Only take this at nighttime.   For chest pain, shortness of breath, inability to keep food or fluids down without vomiting, fever that does not respond to tylenol  or motrin, or any other severe symptoms, please go to the ER for further evaluation. Return to urgent care as needed, otherwise follow-up with PCP.

## 2024-01-24 NOTE — ED Triage Notes (Signed)
 Pt has c/o chills, sore throat, fever, congestion, and dizziness x 1 week. Pt has been taking dayquil and nyquil at home with little relief. Last dose of medication was 2 days of the dayquil.

## 2024-01-24 NOTE — ED Provider Notes (Signed)
 " [facility]    CSN: [id number] Arrival date & time: 01/24/24  9046      History   Chief Complaint Chief Complaint  Patient presents with   Chills   Nasal Congestion    HPI Taegen Storlie is a 19 y.o. male.   This 19 year old male is being seen for complaints of fever, chills, headache, dizziness, decreased appetite, sore throat, congestion, cough ongoing for 1 week.  He has been taking DayQuil and NyQuil with minimal relief of symptoms.  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  He denies ear pain.  He denies chest pain, shortness of breath.  He denies abdominal pain, nausea, vomiting, diarrhea.     Past Medical History:  Diagnosis Date   Prediabetes     Patient Active Problem List   Diagnosis Date Noted   Other hyperlipidemia 10/01/2021   Prediabetes 08/17/2021   Tobacco use 08/17/2021   Mixed hyperlipidemia 07/13/2020   Class 3 severe obesity with serious comorbidity and body mass index (BMI) of 40.0 to 44.9 in adult [facility]) 07/13/2020   Insulin  resistance 10/12/2019   Vitamin D  deficiency 06/23/2019    Past Surgical History:  Procedure Laterality Date   CIRCUMCISION     CIRCUMCISION REVISION         Home Medications    Prior to Admission medications  Not on File    Family History Family History  Problem Relation Age of Onset   Diabetes Mother    Diabetes Other    Diabetes Maternal Grandfather    Hypertension Maternal Grandfather     Social History Social History[1]   Allergies   Patient has no allergy information on record.   Review of Systems Review of Systems  Constitutional:  Positive for activity change, appetite change, chills and fever.  HENT:  Positive for congestion and sore throat. Negative for ear pain, sinus pressure and sinus pain.   Respiratory:  Positive for cough. Negative for shortness of breath.   Cardiovascular:  Negative for chest pain.  Gastrointestinal:   Negative for abdominal pain, diarrhea, nausea and vomiting.  Musculoskeletal:  Negative for myalgias.  Skin:  Negative for color change and rash.  Neurological:  Positive for dizziness and headaches.     Physical Exam Triage Vital Signs ED Triage Vitals [01/24/24 1041]  Encounter Vitals Group     BP 108/71     Girls Systolic BP Percentile      Girls Diastolic BP Percentile      Boys Systolic BP Percentile      Boys Diastolic BP Percentile      Pulse Rate 97     Resp 17     Temp 98.9 F (37.2 C)     Temp Source Oral     SpO2 95 %     Weight      Height      Head Circumference      Peak Flow      Pain Score      Pain Loc      Pain Education      Exclude from Growth Chart    No data found.  Updated Vital Signs BP 108/71 (BP Location: Right Arm)   Pulse 97   Temp 98.9 F (37.2 C) (Oral)   Resp 17   SpO2 95%   Visual Acuity Right Eye Distance:   Left Eye Distance:   Bilateral Distance:    Right Eye  Near:   Left Eye Near:    Bilateral Near:     Physical Exam Vitals and nursing note reviewed.  Constitutional:      General: He is not in acute distress.    Appearance: He is well-developed. He is not toxic-appearing.     Comments: Pleasant male appearing stated age found sitting in chair in no acute distress.  HENT:     Head: Normocephalic and atraumatic.     Nose: Congestion present.     Right Turbinates: Enlarged.     Left Turbinates: Enlarged.     Right Sinus: No maxillary sinus tenderness or frontal sinus tenderness.     Left Sinus: No maxillary sinus tenderness or frontal sinus tenderness.     Mouth/Throat:     Lips: Pink.     Mouth: Mucous membranes are moist.     Pharynx: Posterior oropharyngeal erythema present. No oropharyngeal exudate.  Eyes:     Conjunctiva/sclera: Conjunctivae normal.  Cardiovascular:     Rate and Rhythm: Normal rate and regular rhythm.     Heart sounds: Normal heart sounds. No murmur heard. Pulmonary:     Effort: Pulmonary  effort is normal. No respiratory distress.     Breath sounds: Normal breath sounds.  Abdominal:     General: Bowel sounds are normal.     Palpations: Abdomen is soft.     Tenderness: There is no abdominal tenderness.  Musculoskeletal:     Cervical back: Neck supple.  Skin:    General: Skin is warm and dry.     Capillary Refill: Capillary refill takes less than 2 seconds.  Neurological:     Mental Status: He is alert.  Psychiatric:        Mood and Affect: Mood normal.      UC Treatments / Results  Labs (all labs ordered are listed, but only abnormal results are displayed) Labs Reviewed  POCT RAPID STREP A (OFFICE)    EKG   Radiology No results found.  Procedures Procedures (including critical care time)  Medications Ordered in UC Medications - No data to display  Initial Impression / Assessment and Plan / UC Course  I have reviewed the triage vital signs and the nursing notes.  Pertinent labs & imaging results that were available during my care of the patient were reviewed by me and considered in my medical decision making (see chart for details).     Vitals and triage reviewed, patient is hemodynamically stable.  Strep swab obtained and is negative, will be sent for culture.  Presentation consistent with viral respiratory illness.  He is given prescription for promethazine -DM.  Advised supportive care with Tylenol  and/or ibuprofen, nasal spray.  Advised honey water, salt water gargles, cool-mist humidifier.  Plan of care, follow-up care, return precautions given, no questions at this time.  Work note provided. Final Clinical Impressions(s) / UC Diagnoses   Final diagnoses:  Sore throat   Discharge Instructions   None    ED Prescriptions   None    PDMP not reviewed this encounter.    [1]  Social History Tobacco Use   Smoking status: Never   Smokeless tobacco: Never  Vaping Use   Vaping status: Never Used  Substance Use Topics   Alcohol use: Never    Drug use: Never     Lennice Jon BROCKS, FNP 01/24/24 1206  "

## 2024-01-26 ENCOUNTER — Ambulatory Visit (HOSPITAL_COMMUNITY): Payer: Self-pay

## 2024-01-26 LAB — CULTURE, GROUP A STREP (THRC)
